# Patient Record
Sex: Male | Born: 1975 | Hispanic: No | Marital: Married
Health system: Southern US, Community
[De-identification: ages and names within clinical notes are randomized; demographics above are authoritative.]

---

## 2006-08-17 ENCOUNTER — Encounter: Admission: RE | Admit: 2006-08-17 | Discharge: 2006-09-11 | Payer: Self-pay | Admitting: Family Medicine

## 2008-12-20 ENCOUNTER — Encounter: Admission: RE | Admit: 2008-12-20 | Discharge: 2008-12-20 | Payer: Self-pay | Admitting: Family Medicine

## 2010-02-03 ENCOUNTER — Encounter: Payer: Self-pay | Admitting: Family Medicine

## 2012-11-20 ENCOUNTER — Inpatient Hospital Stay (HOSPITAL_COMMUNITY): Admission: AD | Admit: 2012-11-20 | Payer: Self-pay | Source: Ambulatory Visit | Admitting: Obstetrics and Gynecology

## 2013-02-10 ENCOUNTER — Other Ambulatory Visit: Payer: Self-pay | Admitting: Chiropractic Medicine

## 2013-02-10 ENCOUNTER — Ambulatory Visit
Admission: RE | Admit: 2013-02-10 | Discharge: 2013-02-10 | Disposition: A | Discharge status: Home / Self Care | Payer: BC Managed Care – PPO | Source: Ambulatory Visit | Attending: Chiropractic Medicine | Admitting: Chiropractic Medicine

## 2013-02-10 DIAGNOSIS — M545 Low back pain, unspecified: Secondary | ICD-10-CM

## 2013-09-13 ENCOUNTER — Other Ambulatory Visit: Payer: Self-pay | Admitting: Family Medicine

## 2013-09-13 DIAGNOSIS — R319 Hematuria, unspecified: Secondary | ICD-10-CM

## 2013-09-14 ENCOUNTER — Ambulatory Visit
Admission: RE | Admit: 2013-09-14 | Discharge: 2013-09-14 | Disposition: A | Discharge status: Home / Self Care | Payer: BC Managed Care – PPO | Source: Ambulatory Visit | Attending: Family Medicine | Admitting: Family Medicine

## 2013-09-14 DIAGNOSIS — R319 Hematuria, unspecified: Secondary | ICD-10-CM

## 2016-02-07 DIAGNOSIS — Z0000 Encounter for general adult medical examination without abnormal findings: Secondary | ICD-10-CM | POA: Diagnosis not present

## 2016-02-07 DIAGNOSIS — I10 Essential (primary) hypertension: Secondary | ICD-10-CM | POA: Diagnosis not present

## 2016-05-08 DIAGNOSIS — H5213 Myopia, bilateral: Secondary | ICD-10-CM | POA: Diagnosis not present

## 2016-08-27 DIAGNOSIS — M5416 Radiculopathy, lumbar region: Secondary | ICD-10-CM | POA: Diagnosis not present

## 2016-08-29 DIAGNOSIS — M5116 Intervertebral disc disorders with radiculopathy, lumbar region: Secondary | ICD-10-CM | POA: Diagnosis not present

## 2016-08-29 DIAGNOSIS — M4726 Other spondylosis with radiculopathy, lumbar region: Secondary | ICD-10-CM | POA: Diagnosis not present

## 2016-09-08 DIAGNOSIS — M5126 Other intervertebral disc displacement, lumbar region: Secondary | ICD-10-CM | POA: Diagnosis not present

## 2016-09-08 DIAGNOSIS — I10 Essential (primary) hypertension: Secondary | ICD-10-CM | POA: Diagnosis not present

## 2016-09-09 DIAGNOSIS — M79605 Pain in left leg: Secondary | ICD-10-CM | POA: Diagnosis not present

## 2016-09-09 DIAGNOSIS — M545 Low back pain: Secondary | ICD-10-CM | POA: Diagnosis not present

## 2016-09-09 DIAGNOSIS — M6281 Muscle weakness (generalized): Secondary | ICD-10-CM | POA: Diagnosis not present

## 2016-09-16 DIAGNOSIS — M79605 Pain in left leg: Secondary | ICD-10-CM | POA: Diagnosis not present

## 2016-09-16 DIAGNOSIS — M6281 Muscle weakness (generalized): Secondary | ICD-10-CM | POA: Diagnosis not present

## 2016-09-16 DIAGNOSIS — M545 Low back pain: Secondary | ICD-10-CM | POA: Diagnosis not present

## 2016-09-22 DIAGNOSIS — M6281 Muscle weakness (generalized): Secondary | ICD-10-CM | POA: Diagnosis not present

## 2016-09-22 DIAGNOSIS — M545 Low back pain: Secondary | ICD-10-CM | POA: Diagnosis not present

## 2016-09-22 DIAGNOSIS — M79605 Pain in left leg: Secondary | ICD-10-CM | POA: Diagnosis not present

## 2016-09-25 DIAGNOSIS — M6281 Muscle weakness (generalized): Secondary | ICD-10-CM | POA: Diagnosis not present

## 2016-09-25 DIAGNOSIS — M79605 Pain in left leg: Secondary | ICD-10-CM | POA: Diagnosis not present

## 2016-09-25 DIAGNOSIS — M545 Low back pain: Secondary | ICD-10-CM | POA: Diagnosis not present

## 2016-09-30 DIAGNOSIS — M545 Low back pain: Secondary | ICD-10-CM | POA: Diagnosis not present

## 2016-09-30 DIAGNOSIS — M79605 Pain in left leg: Secondary | ICD-10-CM | POA: Diagnosis not present

## 2016-09-30 DIAGNOSIS — M6281 Muscle weakness (generalized): Secondary | ICD-10-CM | POA: Diagnosis not present

## 2016-10-06 DIAGNOSIS — M5126 Other intervertebral disc displacement, lumbar region: Secondary | ICD-10-CM | POA: Diagnosis not present

## 2016-10-08 DIAGNOSIS — M6281 Muscle weakness (generalized): Secondary | ICD-10-CM | POA: Diagnosis not present

## 2016-10-08 DIAGNOSIS — M545 Low back pain: Secondary | ICD-10-CM | POA: Diagnosis not present

## 2016-10-08 DIAGNOSIS — M79605 Pain in left leg: Secondary | ICD-10-CM | POA: Diagnosis not present

## 2016-10-14 DIAGNOSIS — Z23 Encounter for immunization: Secondary | ICD-10-CM | POA: Diagnosis not present

## 2017-01-30 DIAGNOSIS — Z0000 Encounter for general adult medical examination without abnormal findings: Secondary | ICD-10-CM | POA: Diagnosis not present

## 2017-01-30 DIAGNOSIS — Z13220 Encounter for screening for lipoid disorders: Secondary | ICD-10-CM | POA: Diagnosis not present

## 2017-01-30 DIAGNOSIS — I10 Essential (primary) hypertension: Secondary | ICD-10-CM | POA: Diagnosis not present

## 2017-03-20 DIAGNOSIS — K219 Gastro-esophageal reflux disease without esophagitis: Secondary | ICD-10-CM | POA: Diagnosis not present

## 2017-03-20 DIAGNOSIS — R55 Syncope and collapse: Secondary | ICD-10-CM | POA: Diagnosis not present

## 2017-06-26 DIAGNOSIS — J208 Acute bronchitis due to other specified organisms: Secondary | ICD-10-CM | POA: Diagnosis not present

## 2017-10-12 DIAGNOSIS — J019 Acute sinusitis, unspecified: Secondary | ICD-10-CM | POA: Diagnosis not present

## 2018-04-14 DIAGNOSIS — I10 Essential (primary) hypertension: Secondary | ICD-10-CM | POA: Diagnosis not present

## 2019-01-26 DIAGNOSIS — Z0000 Encounter for general adult medical examination without abnormal findings: Secondary | ICD-10-CM | POA: Diagnosis not present

## 2019-02-11 DIAGNOSIS — R718 Other abnormality of red blood cells: Secondary | ICD-10-CM | POA: Diagnosis not present

## 2019-02-11 DIAGNOSIS — Z131 Encounter for screening for diabetes mellitus: Secondary | ICD-10-CM | POA: Diagnosis not present

## 2019-02-11 DIAGNOSIS — E7800 Pure hypercholesterolemia, unspecified: Secondary | ICD-10-CM | POA: Diagnosis not present

## 2019-02-11 DIAGNOSIS — Z0000 Encounter for general adult medical examination without abnormal findings: Secondary | ICD-10-CM | POA: Diagnosis not present

## 2019-02-15 DIAGNOSIS — R7301 Impaired fasting glucose: Secondary | ICD-10-CM | POA: Diagnosis not present

## 2019-02-15 DIAGNOSIS — R718 Other abnormality of red blood cells: Secondary | ICD-10-CM | POA: Diagnosis not present

## 2019-09-09 DIAGNOSIS — E7800 Pure hypercholesterolemia, unspecified: Secondary | ICD-10-CM | POA: Diagnosis not present

## 2019-09-09 DIAGNOSIS — R7309 Other abnormal glucose: Secondary | ICD-10-CM | POA: Diagnosis not present

## 2019-12-07 ENCOUNTER — Other Ambulatory Visit: Payer: Self-pay | Admitting: Physician Assistant

## 2019-12-07 DIAGNOSIS — R1030 Lower abdominal pain, unspecified: Secondary | ICD-10-CM

## 2019-12-07 DIAGNOSIS — R634 Abnormal weight loss: Secondary | ICD-10-CM

## 2019-12-15 ENCOUNTER — Ambulatory Visit
Admission: RE | Admit: 2019-12-15 | Discharge: 2019-12-15 | Disposition: A | Discharge status: Home / Self Care | Payer: 59 | Source: Ambulatory Visit | Attending: Physician Assistant | Admitting: Physician Assistant

## 2019-12-15 DIAGNOSIS — R634 Abnormal weight loss: Secondary | ICD-10-CM

## 2019-12-15 DIAGNOSIS — R1030 Lower abdominal pain, unspecified: Secondary | ICD-10-CM

## 2019-12-15 MED ORDER — IOPAMIDOL (ISOVUE-300) INJECTION 61%
100.0000 mL | Freq: Once | INTRAVENOUS | Status: AC | PRN
  Administered 2019-12-15: 100 mL via INTRAVENOUS

## 2019-12-27 ENCOUNTER — Other Ambulatory Visit: Payer: Self-pay

## 2021-08-10 IMAGING — CT CT ABD-PELV W/ CM
2 of 5 series · 12 of 46 positions shown, 14 images · IV contrast (iopamidol)
Comparison: 10/10/2013 from [HOSPITAL]

CLINICAL DATA: Abdominal pain and unintentional weight loss.

Creatinine was obtained on site at [HOSPITAL] at [REDACTED].
Results: Creatinine 0.8 mg/dL.
EXAM:
CT ABDOMEN AND PELVIS WITH CONTRAST
TECHNIQUE: Multidetector CT imaging of the abdomen and pelvis was performed
using the standard protocol following bolus administration of
intravenous contrast.
CONTRAST:  100mL [id number] IOPAMIDOL ([id number]) INJECTION 61%

[Series 2: abd pelvis 5.00 br40 s3 axial · axial · 0.50mm/px · z∈[+1280,+1665]mm · 9 of 89 slices shown, 11 images]
[im 6/89  soft-tissue]
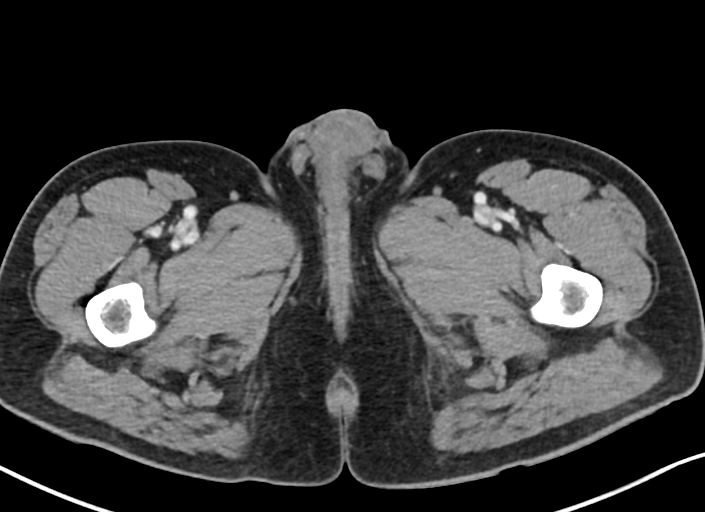
[im 6/89  bone]
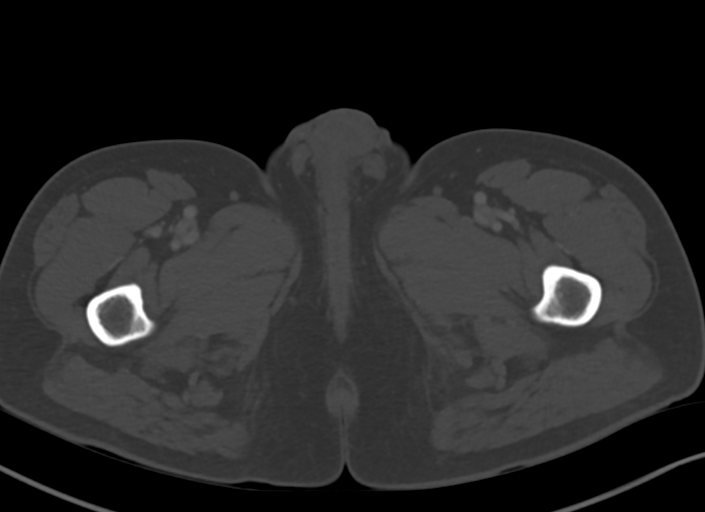
[im 16/89  soft-tissue]
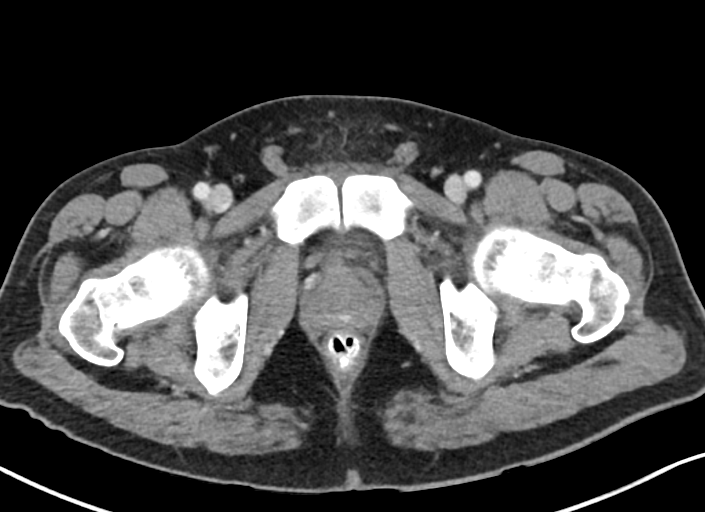
[im 26/89  soft-tissue]
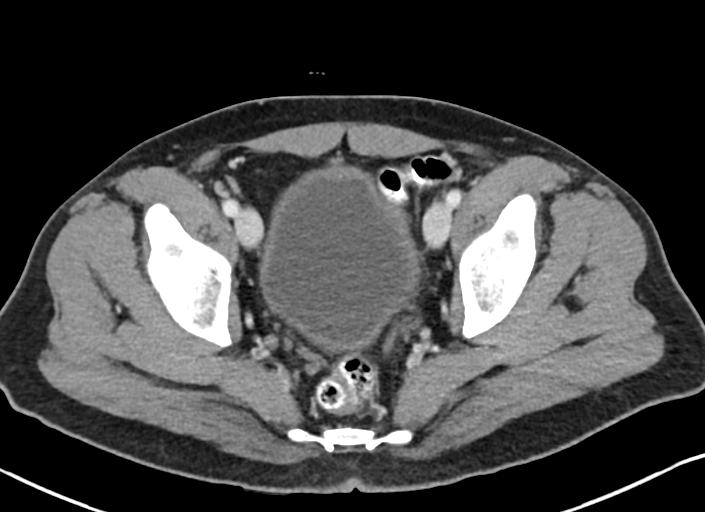
[im 37/89  soft-tissue]
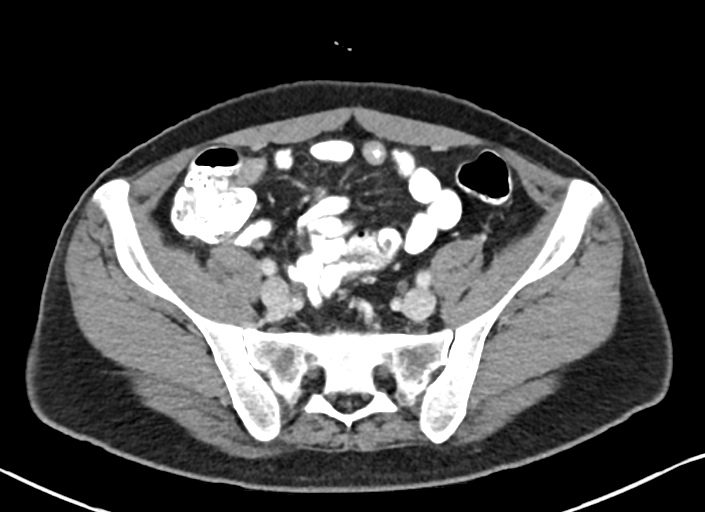
[im 47/89  soft-tissue]
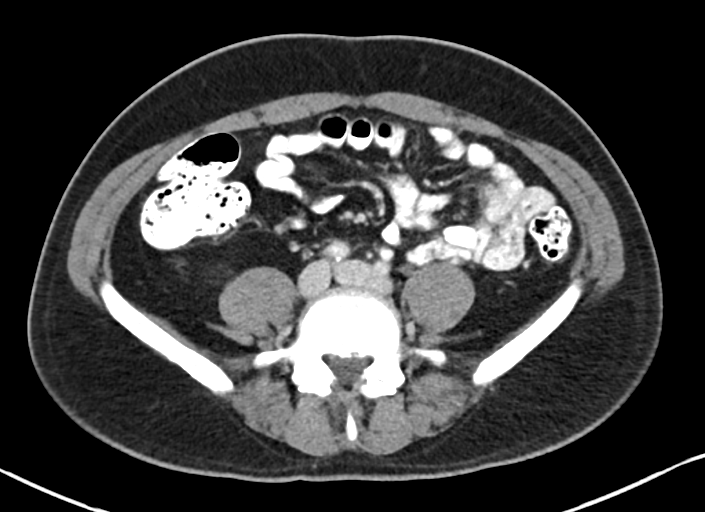
[im 52/89  soft-tissue]
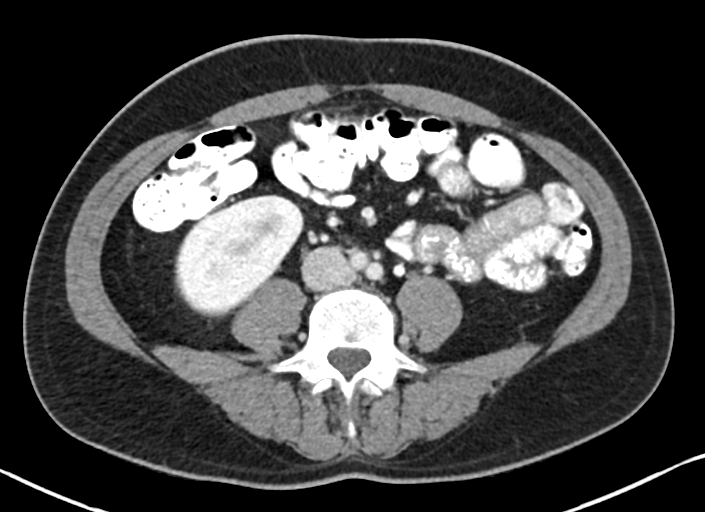
[im 63/89  soft-tissue]
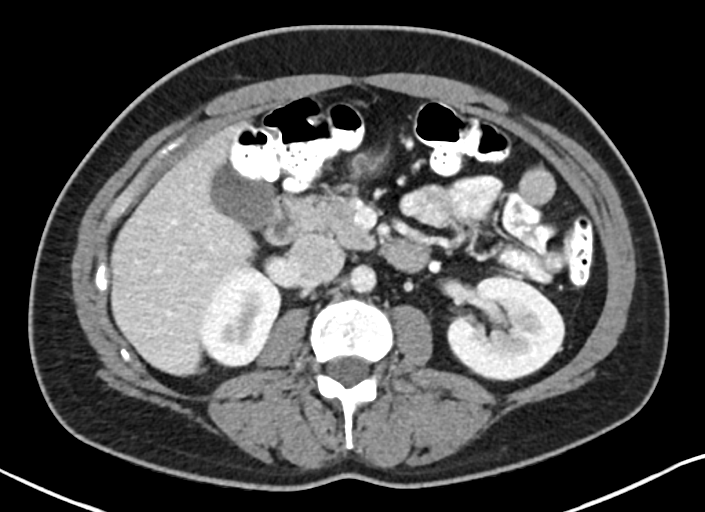
[im 73/89  soft-tissue]
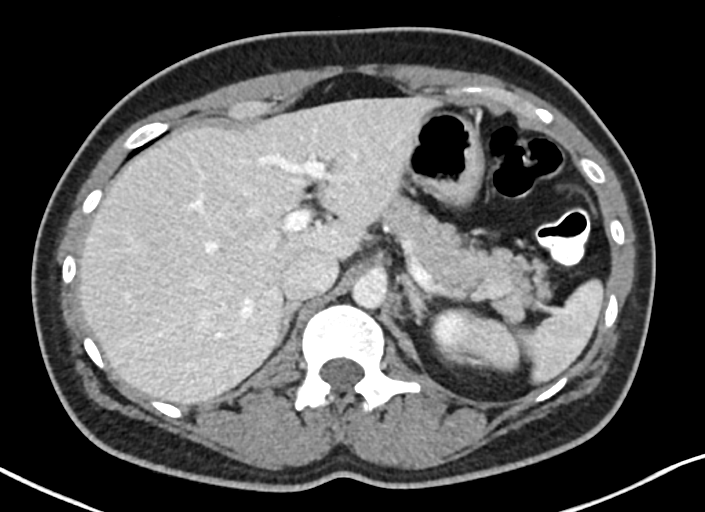
[im 83/89  soft-tissue]
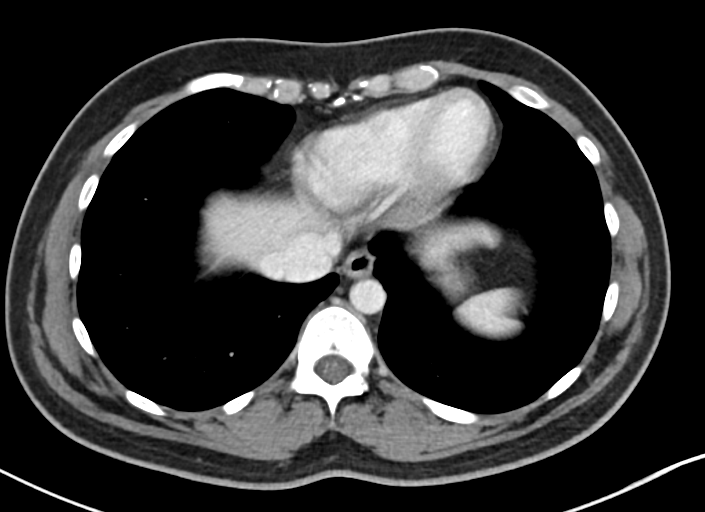
[im 83/89  bone]
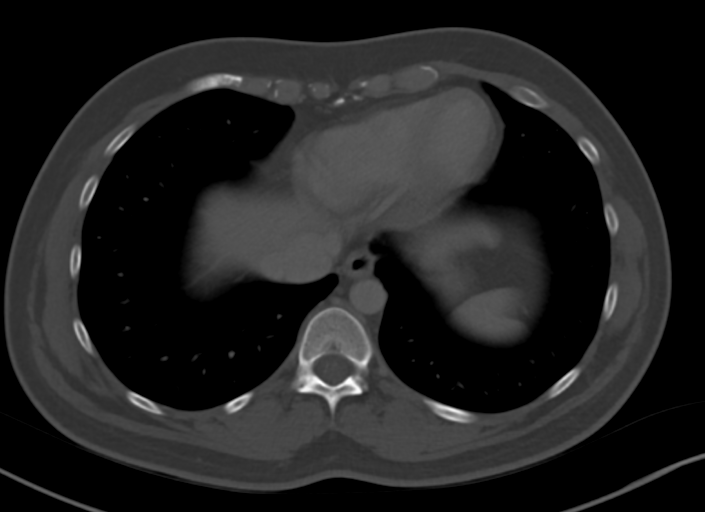

[Series 6: abd pelvis 2.00 br40 s3 cor · coronal · 0.69mm/px · 3 of 120 slices shown]
[im 40/120  soft-tissue]
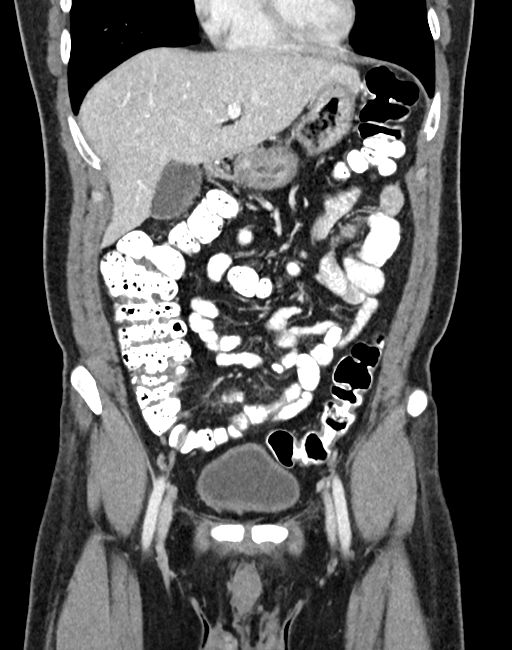
[im 53/120  soft-tissue]
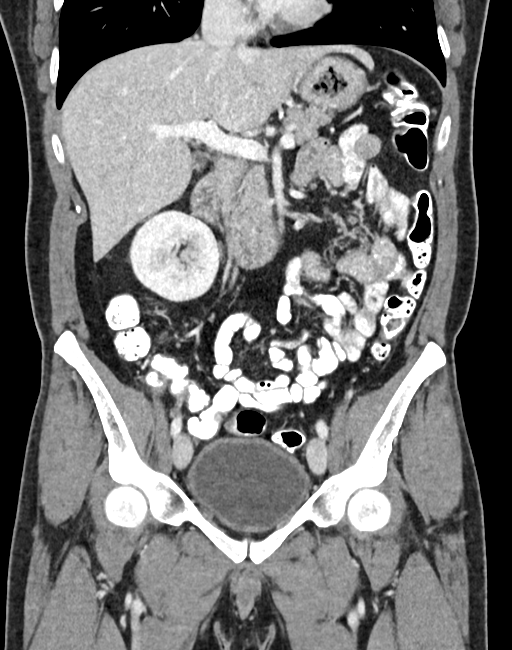
[im 67/120  soft-tissue]
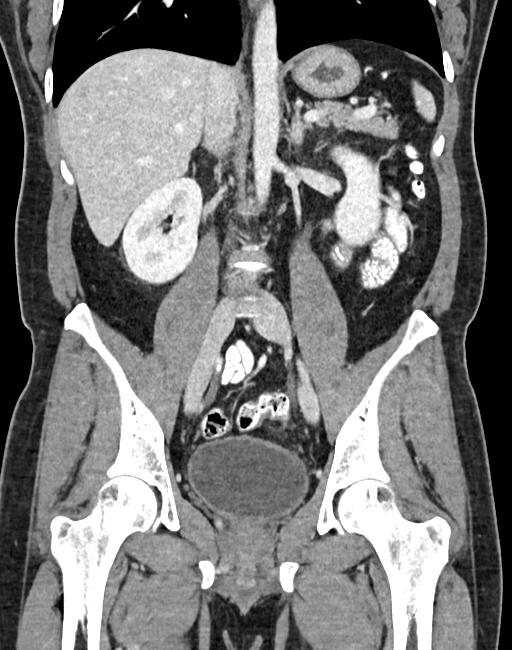

[12 of 46 positions shown; findings below may reference images not displayed]

FINDINGS: Lower Chest: No acute findings.

Hepatobiliary: A tiny benign hemangioma is seen in right hepatic
lobe. No other hepatic masses identified. Gallbladder is
unremarkable. No evidence of biliary ductal dilatation.

Pancreas:  No mass or inflammatory changes.

Spleen: Within normal limits in size and appearance.

Adrenals/Urinary Tract: No masses identified. No evidence of
ureteral calculi or hydronephrosis.

Stomach/Bowel: No evidence of obstruction, inflammatory process or
abnormal fluid collections. Normal appendix visualized.

Vascular/Lymphatic: No pathologically enlarged lymph nodes. No
abdominal aortic aneurysm.

Reproductive:  No mass or other significant abnormality.

Other:  None.

Musculoskeletal: No suspicious bone lesions identified. Several tiny
sclerotic lesions are seen in the pelvis and hips which are stable
and most likely represent tiny bone islands.
IMPRESSION: No acute findings other significant abnormality.

## 2023-02-26 ENCOUNTER — Other Ambulatory Visit (HOSPITAL_COMMUNITY): Payer: Self-pay | Admitting: Family Medicine

## 2023-02-26 DIAGNOSIS — E7800 Pure hypercholesterolemia, unspecified: Secondary | ICD-10-CM

## 2023-03-23 ENCOUNTER — Ambulatory Visit (HOSPITAL_BASED_OUTPATIENT_CLINIC_OR_DEPARTMENT_OTHER)
Admission: RE | Admit: 2023-03-23 | Discharge: 2023-03-23 | Disposition: A | Discharge status: Home / Self Care | Payer: Self-pay | Source: Ambulatory Visit | Attending: Family Medicine | Admitting: Family Medicine

## 2023-03-23 DIAGNOSIS — E7800 Pure hypercholesterolemia, unspecified: Secondary | ICD-10-CM | POA: Insufficient documentation
